# Patient Record
Sex: Male | Born: 1940 | Race: Black or African American | Hispanic: No | Marital: Married | Smoking: Never smoker
Health system: Southern US, Community
[De-identification: ages and names within clinical notes are randomized; demographics above are authoritative.]

---

## 2016-05-16 ENCOUNTER — Emergency Department
Admission: EM | Admit: 2016-05-16 | Discharge: 2016-05-17 | Payer: Medicare Other | Attending: Emergency Medicine | Admitting: Emergency Medicine

## 2016-05-16 ENCOUNTER — Emergency Department: Payer: Medicare Other

## 2016-05-16 DIAGNOSIS — Z7982 Long term (current) use of aspirin: Secondary | ICD-10-CM | POA: Diagnosis not present

## 2016-05-16 DIAGNOSIS — R55 Syncope and collapse: Secondary | ICD-10-CM | POA: Diagnosis present

## 2016-05-16 DIAGNOSIS — Z79899 Other long term (current) drug therapy: Secondary | ICD-10-CM | POA: Diagnosis not present

## 2016-05-16 DIAGNOSIS — R9431 Abnormal electrocardiogram [ECG] [EKG]: Secondary | ICD-10-CM | POA: Diagnosis not present

## 2016-05-16 LAB — COMPREHENSIVE METABOLIC PANEL
ALT: 18 U/L (ref 17–63)
AST: 30 U/L (ref 15–41)
Albumin: 4.3 g/dL (ref 3.5–5.0)
Alkaline Phosphatase: 85 U/L (ref 38–126)
Anion gap: 6 (ref 5–15)
BILIRUBIN TOTAL: 0.8 mg/dL (ref 0.3–1.2)
BUN: 20 mg/dL (ref 6–20)
CALCIUM: 9 mg/dL (ref 8.9–10.3)
CHLORIDE: 105 mmol/L (ref 101–111)
CO2: 26 mmol/L (ref 22–32)
CREATININE: 1.39 mg/dL — AB (ref 0.61–1.24)
GFR, EST AFRICAN AMERICAN: 55 mL/min — AB (ref >60)
GFR, EST NON AFRICAN AMERICAN: 48 mL/min — AB (ref >60)
Glucose, Bld: 116 mg/dL — ABNORMAL HIGH (ref 65–99)
Potassium: 4.3 mmol/L (ref 3.5–5.1)
Sodium: 137 mmol/L (ref 135–145)
TOTAL PROTEIN: 7.8 g/dL (ref 6.5–8.1)

## 2016-05-16 LAB — CBC WITH DIFFERENTIAL/PLATELET
BASOS ABS: 0 10*3/uL (ref 0–0.1)
BASOS PCT: 0 %
EOS ABS: 0 10*3/uL (ref 0–0.7)
Eosinophils Relative: 1 %
HCT: 43.2 % (ref 40.0–52.0)
Hemoglobin: 14.5 g/dL (ref 13.0–18.0)
Lymphocytes Relative: 15 %
Lymphs Abs: 1 10*3/uL (ref 1.0–3.6)
MCH: 32.6 pg (ref 26.0–34.0)
MCHC: 33.5 g/dL (ref 32.0–36.0)
MCV: 97.1 fL (ref 80.0–100.0)
MONO ABS: 0.4 10*3/uL (ref 0.2–1.0)
Monocytes Relative: 7 %
Neutro Abs: 5.2 10*3/uL (ref 1.4–6.5)
Neutrophils Relative %: 77 %
PLATELETS: 186 10*3/uL (ref 150–440)
RBC: 4.44 MIL/uL (ref 4.40–5.90)
RDW: 14.3 % (ref 11.5–14.5)
WBC: 6.6 10*3/uL (ref 3.8–10.6)

## 2016-05-16 LAB — TROPONIN I

## 2016-05-16 MED ORDER — ACETAMINOPHEN 325 MG PO TABS
ORAL_TABLET | ORAL | Status: AC
  Filled 2016-05-16: qty 2

## 2016-05-16 MED ORDER — ACETAMINOPHEN 325 MG PO TABS
650.0000 mg | ORAL_TABLET | Freq: Once | ORAL | Status: AC
  Administered 2016-05-16: 650 mg via ORAL

## 2016-05-16 NOTE — ED Notes (Signed)
[facility] on [facility], spoke to [place], information passed on to ED MD waiting on acceptance [phone]

## 2016-05-16 NOTE — ED Provider Notes (Addendum)
[facility] Emergency Department Provider Note   ____________________________________________   First MD Initiated Contact with Patient 05/16/16 1151     (approximate)  I have reviewed the triage vital signs and the nursing notes.   HISTORY  Chief Complaint Loss of Consciousness   HPI Arnold LongDaniel Arciniega is a 75 y.o. male [facility] patient who reports he was getting out of the car today Hot headed and then passed out he was not out for very long. He says last time this happened [place]. He also reports he had a very bad headache gradual onset diffuse came on before he passed out and is now getting much better. He has had headaches like this in the past. He does not have any chest pain shortness of breath or any other complaints at the present time.   No past medical history on file. 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   No past surgical history on file.  Prior to Admission medications   Medication Sig Start Date End Date Taking? Authorizing Provider  amLODipine (NORVASC) 5 MG tablet Take 5 mg by mouth daily. 01/17/16 01/16/17 Yes Historical Provider, MD  aspirin EC 81 MG tablet Take 81 mg by mouth daily.   Yes Historical Provider, MD  clopidogrel (PLAVIX) 75 MG tablet Take 75 mg by mouth daily. 04/13/16  Yes Historical Provider, MD  hydrOXYzine (VISTARIL) 25 MG capsule Take 25 mg by mouth daily as needed.   Yes Historical Provider, MD  lisinopril (PRINIVIL,ZESTRIL) 40 MG tablet Take 40 mg by mouth daily.   Yes Historical Provider, MD  nitroGLYCERIN (NITROSTAT) 0.4 MG SL tablet Place 0.4 mg under the tongue every 5 (five) minutes x 3 doses as needed.   Yes Historical Provider, MD    Allergies Lisinopril; Beta adrenergic blockers; and Simvastatin  No family history on  file.  Social History Social History  Substance Use Topics  . Smoking status: Not on file  . Smokeless tobacco: Not on file  . Alcohol use Not on file    Review of Systems Constitutional: No fever/chills Eyes: No visual changes. ENT: No sore throat. Cardiovascular: Denies chest pain. Respiratory: Denies shortness of breath. Gastrointestinal: No abdominal pain.  No nausea, no vomiting.  No diarrhea.  No constipation. Genitourinary: Negative for dysuria. Musculoskeletal: Negative for back pain. Skin: Negative for rash.  10-point ROS otherwise negative.  ____________________________________________   PHYSICAL EXAM:  VITAL SIGNS: ED Triage Vitals  Enc Vitals Group     BP 05/16/16 1149 98/72     Pulse Rate 05/16/16 1149 66     Resp 05/16/16 1149 18     Temp 05/16/16 1149 98.1 F (36.7 C)     Temp Source 05/16/16 1149 Oral     SpO2 05/16/16 1149 94 %     Weight 05/16/16 1150 160 lb (72.6 kg)     Height 05/16/16 1150 5\' 10"  (1.778 m)     Head Circumference --      Peak Flow --      Pain Score --      Pain Loc --      Pain Edu? --      Excl. in GC? --     Constitutional: Alert and oriented. Well appearing and in no acute distress. Eyes: Conjunctivae are normal.  PERRL. EOMI. Head: Atraumatic. Nose: No congestion/rhinnorhea. Mouth/Throat: Mucous membranes are moist.  Oropharynx non-erythematous. Neck: No stridor.  Cardiovascular: Normal rate, regular rhythm. Grossly normal heart sounds.  Good peripheral circulation. Respiratory: Normal respiratory effort.  No retractions. Lungs CTAB. Gastrointestinal: Soft and nontender. No distention. No abdominal bruits. No CVA tenderness. Musculoskeletal: No lower extremity tenderness nor edema.  No joint effusions. Neurologic:  Normal speech and language. Cranial nerves II through XII are intact cerebellar finger to nose is slow but intact patient has been intention tremor on the right side in the right arm with any movements he  does not have a resting tremor. He has some weakness in the right arm. This is all chronic.  ____________________________________________   LABS (all labs ordered are listed, but only abnormal results are displayed)  Labs Reviewed  COMPREHENSIVE METABOLIC PANEL - Abnormal; Notable for the following:       Result Value   Glucose, Bld 116 (*)    Creatinine, Ser 1.39 (*)    GFR calc non Af Amer 48 (*)    GFR calc Af Amer 55 (*)    All other components within normal limits  TROPONIN I  CBC WITH DIFFERENTIAL/PLATELET  TROPONIN I   ____________________________________________  EKG  KG read and is interpreted by me shows normal sinus rhythm rate of 64 first-degree AV block left axis patient has flipped T's inferiorly and in the V5 and 6 and lateral chest leads there is minimal ST elevation in V1 through 3. I do not have any old EKGs  Second EKG shows increasing T wave inversions laterally. EKG was obtained for [facility] where his last EKG was done and this one also looks worse than the old one from [facility]. Discussed with hospitalist and he agrees we should admit the patient at least for observation. ____________________________________________  RADIOLOGY  Study Result   CLINICAL DATA:  Syncopal episode today when getting out of a car, felt hot then passed out, headache this morning, initial encounter  EXAM: CT HEAD WITHOUT CONTRAST  TECHNIQUE: Contiguous axial images were obtained from the base of the skull through the vertex without intravenous contrast.  COMPARISON:  None  FINDINGS: Brain: Mild age-related atrophy. Normal ventricular morphology. No midline shift or mass effect. Small vessel chronic ischemic changes of deep cerebral white matter. No intracranial hemorrhage, mass lesion, evidence of acute infarction, or extra-axial fluid collection.  Vascular: Normal appearance  Skull: Intact  Sinuses/Orbits: Clear  Other: N/A  IMPRESSION: Small vessel chronic  ischemic changes of deep cerebral white matter.  No acute intracranial abnormalities.   Electronically Signed   By: Ulyses Southward M.D.   On: 05/16/2016 12:23   Study Result   CLINICAL DATA:  Syncopal episode today  EXAM: CHEST  2 VIEW  COMPARISON:  None.  FINDINGS: Cardiac shadow is within normal limits. The lungs are well aerated bilaterally. Minimal bibasilar atelectasis is seen. Prior left shoulder replacement is noted. No other bony abnormality is seen.  IMPRESSION: Minimal bibasilar atelectasis.   Electronically Signed   By: Alcide Clever M.D.   On: 05/16/2016 12:36     ____________________________________________   PROCEDURES  Procedure(s) performed:   Procedures  Critical Care performed: Critical care time 45 minutes. Discussed with patient risks of transfer to the [place] patient if she wanted to just go home but with the EKG looking different I discussed with him the need for admission and he agreed and then I discussed with him the fact that since he is a  [facility] if [facility] not at [facility] [place] [place] patient thought about it for sometime and then decided to go [place] which time I signed his cons   Clinical Course      ____________________________________________   FINAL CLINICAL IMPRESSION(S) / ED DIAGNOSES  Final diagnoses:  Syncope and collapse  Abnormal EKG      NEW MEDICATIONS STARTED DURING THIS VISIT:  New Prescriptions   No medications on file     Note:  This document was prepared using Dragon voice recognition software and may include unintentional dictation errors.    Arnaldo NatalPaul F Malinda, MD 05/16/16 1827   [facility]'re awaiting the [facility] to determine if they want to [facility] this patient not. I had to talk him into staying twice now because of his EKG changes. The [facility] would not [facility] him I would   Arnaldo NatalPaul F Malinda, MD 05/16/16 2018

## 2016-05-16 NOTE — ED Triage Notes (Signed)
Pt arrived via EMS for syncopal episode today when getting out of his car. Pt reports feeling hot and then passed out. Pt reports headache this morning, denies pain on arrival. EMS reports 110/77, 74 HR, 95% RA, CBG 159. EMS reports pt took 81 mg ASA this morning. EMS administered ASA 243 mg.

## 2016-05-16 NOTE — ED Notes (Signed)
[facility] to check on [facility], Ms Fred Johnson said they were reviewing and processing the request.  [phone]

## 2016-05-16 NOTE — ED Notes (Signed)
Pt states he wishes to sign out AMA, MD notified and to bedside to speak with patient at this time.

## 2016-05-17 NOTE — ED Notes (Signed)
BP in original emtala entered incorrectly; BP 132/76, NOT 232/76

## 2016-05-17 NOTE — ED Notes (Signed)
[facility] when [facility] is on the way.   [phone] Ext: [phone]

## 2016-05-17 NOTE — ED Notes (Signed)
RN spoke with Consulting civil engineerCharge RN at [facility] and let her know EMS has arrived and patient on the way

## 2018-06-20 IMAGING — CT CT HEAD W/O CM
3 series · 15 of 45 positions shown, 18 images · non-contrast
Comparison: None

CLINICAL DATA: Syncopal episode today when getting out of a car,
felt hot then passed out, headache this morning, initial encounter

EXAM:
CT HEAD WITHOUT CONTRAST
TECHNIQUE: Contiguous axial images were obtained from the base of the skull
through the vertex without intravenous contrast.

[Series 2: head wo · axial · 0.47mm/px · z∈[-141,-26]mm · 9 of 28 slices shown, 12 images]
[im 3/28  brain]
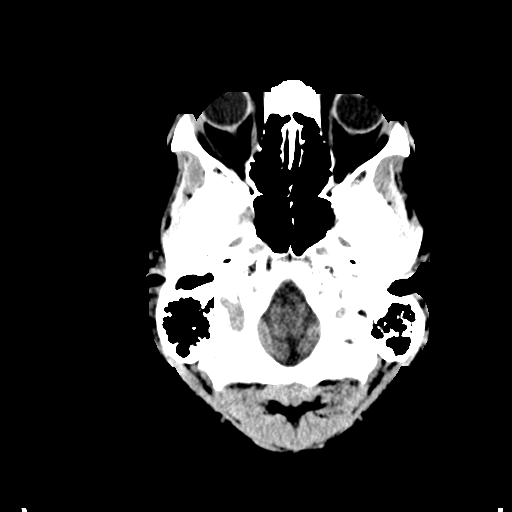
[im 3/28  bone]
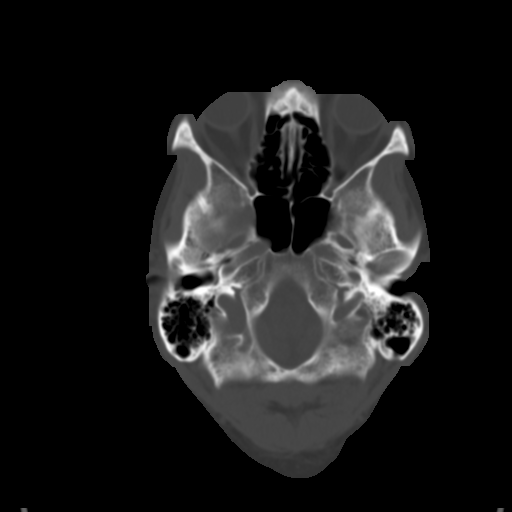
[im 6/28  brain]
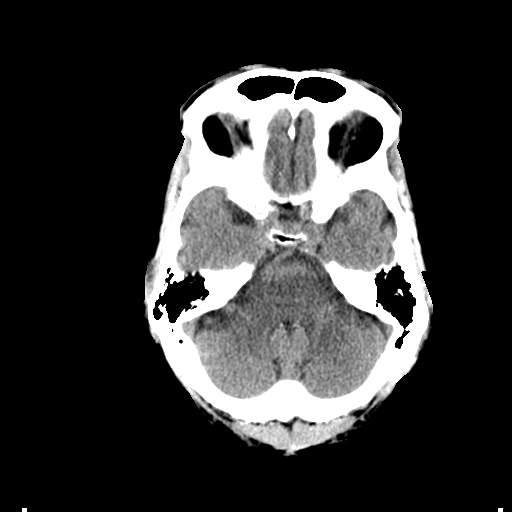
[im 9/28  brain]
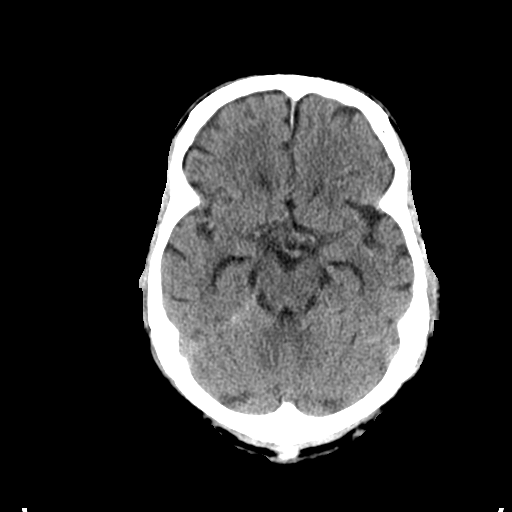
[im 12/28  brain]
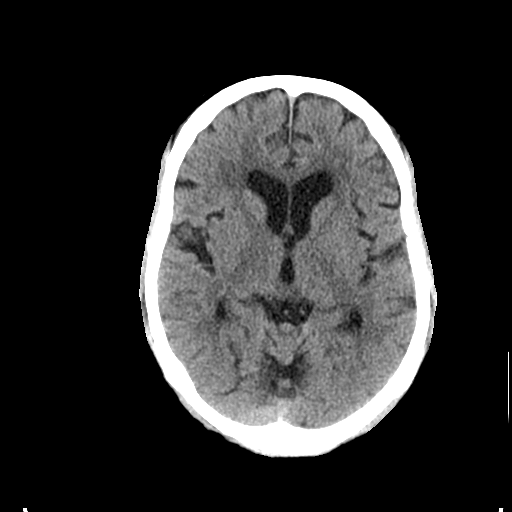
[im 15/28  brain]
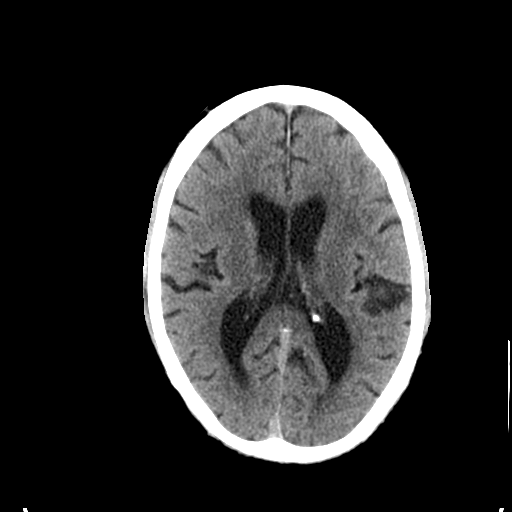
[im 15/28  bone]
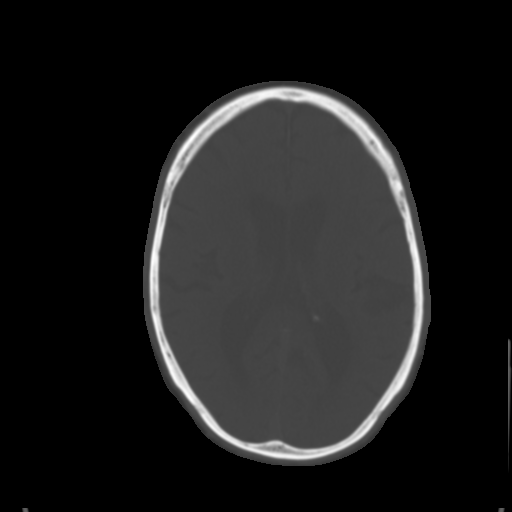
[im 17/28  brain]
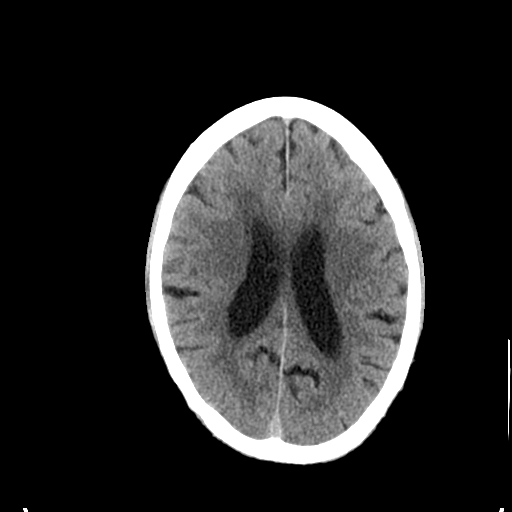
[im 20/28  brain]
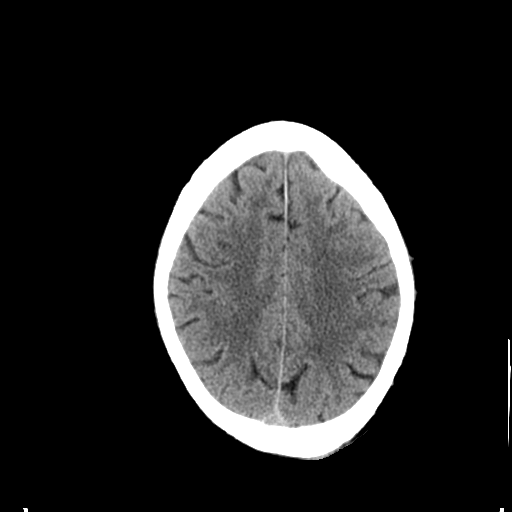
[im 23/28  brain]
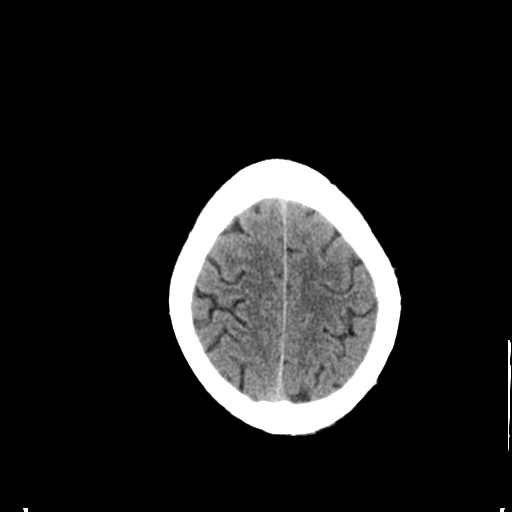
[im 26/28  brain]
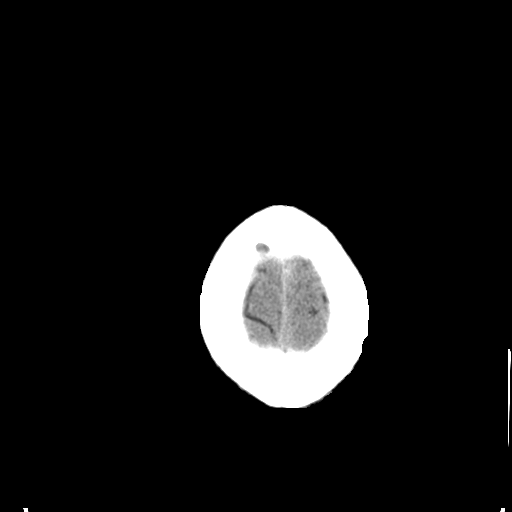
[im 26/28  bone]
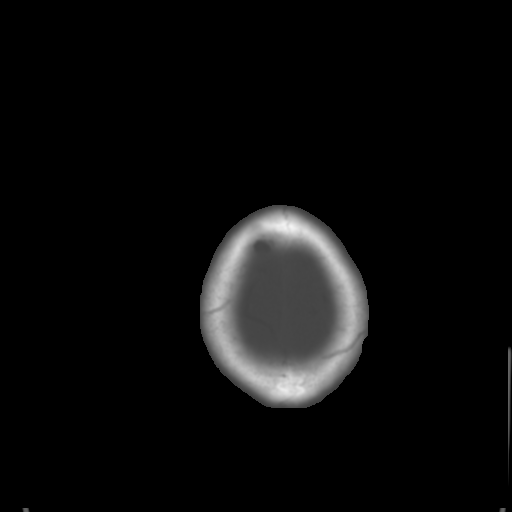

[Series 4: coronal soft tissue · coronal · 0.27mm/px · 3 of 61 slices shown]
[im 21/61  brain]
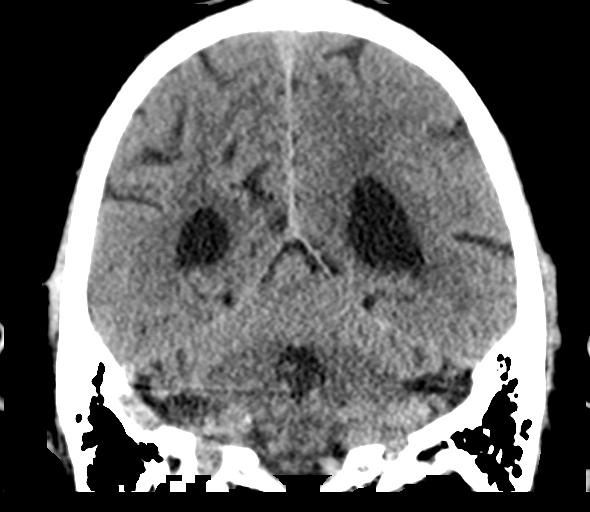
[im 27/61  brain]
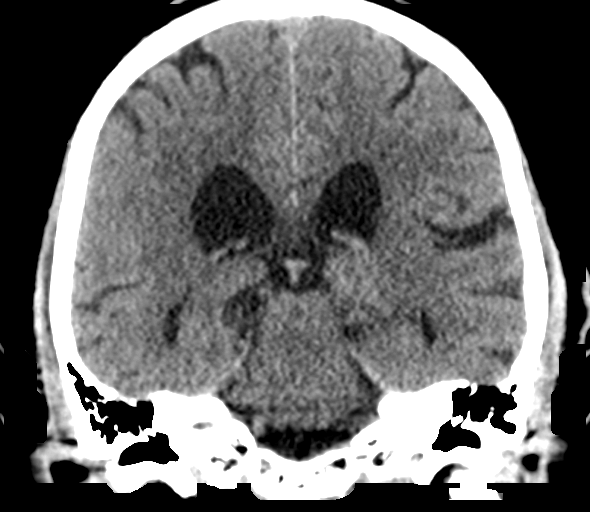
[im 34/61  brain]
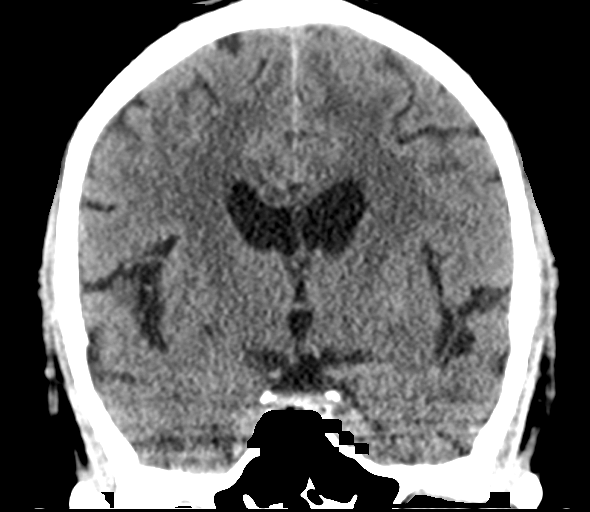

[Series 5: sagittal soft tissue · sagittal · 0.31mm/px · 3 of 49 slices shown]
[im 17/49  brain]
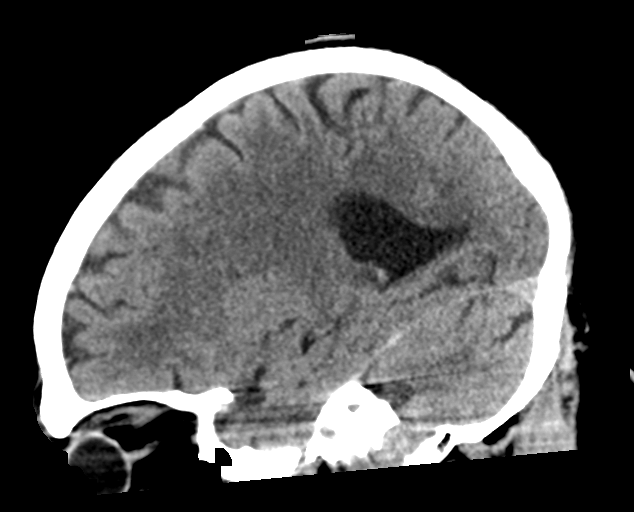
[im 25/49  brain]
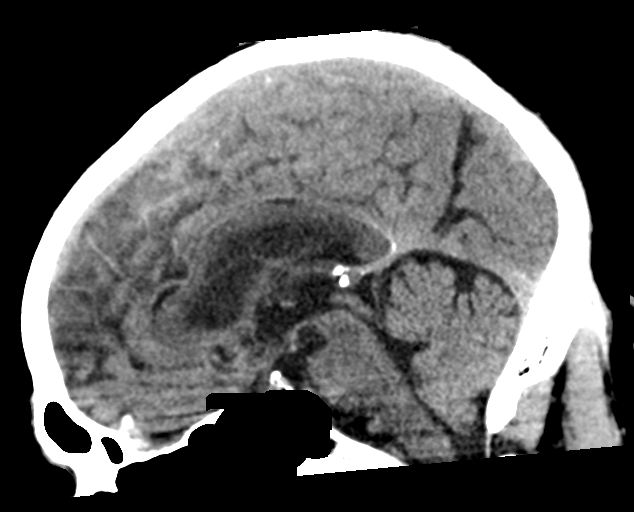
[im 33/49  brain]
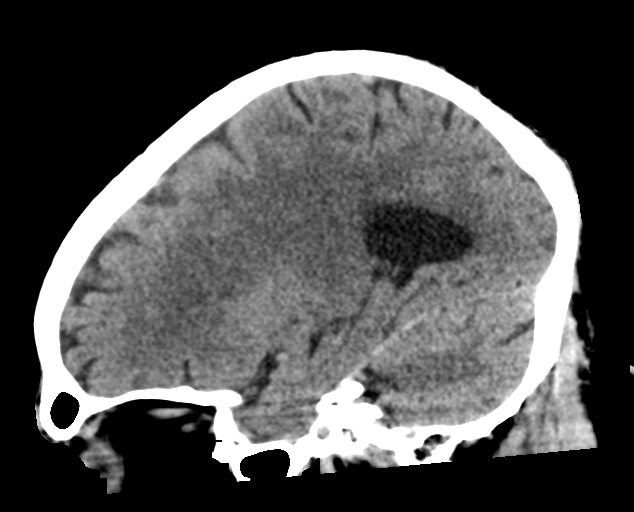

[15 of 45 positions shown; findings below may reference images not displayed]

FINDINGS: Brain: Mild age-related atrophy. Normal ventricular morphology. No
midline shift or mass effect. Small vessel chronic ischemic changes
of deep cerebral white matter. No intracranial hemorrhage, mass
lesion, evidence of acute infarction, or extra-axial fluid
collection.

Vascular: Normal appearance

Skull: Intact

Sinuses/Orbits: Clear

Other: N/A
IMPRESSION: Small vessel chronic ischemic changes of deep cerebral white matter.

No acute intracranial abnormalities.

## 2018-06-20 IMAGING — CR DG CHEST 2V
2 series · 2 of 2 positions shown · non-contrast
Comparison: None.

CLINICAL DATA: Syncopal episode today

EXAM:
CHEST  2 VIEW

[chest lat]
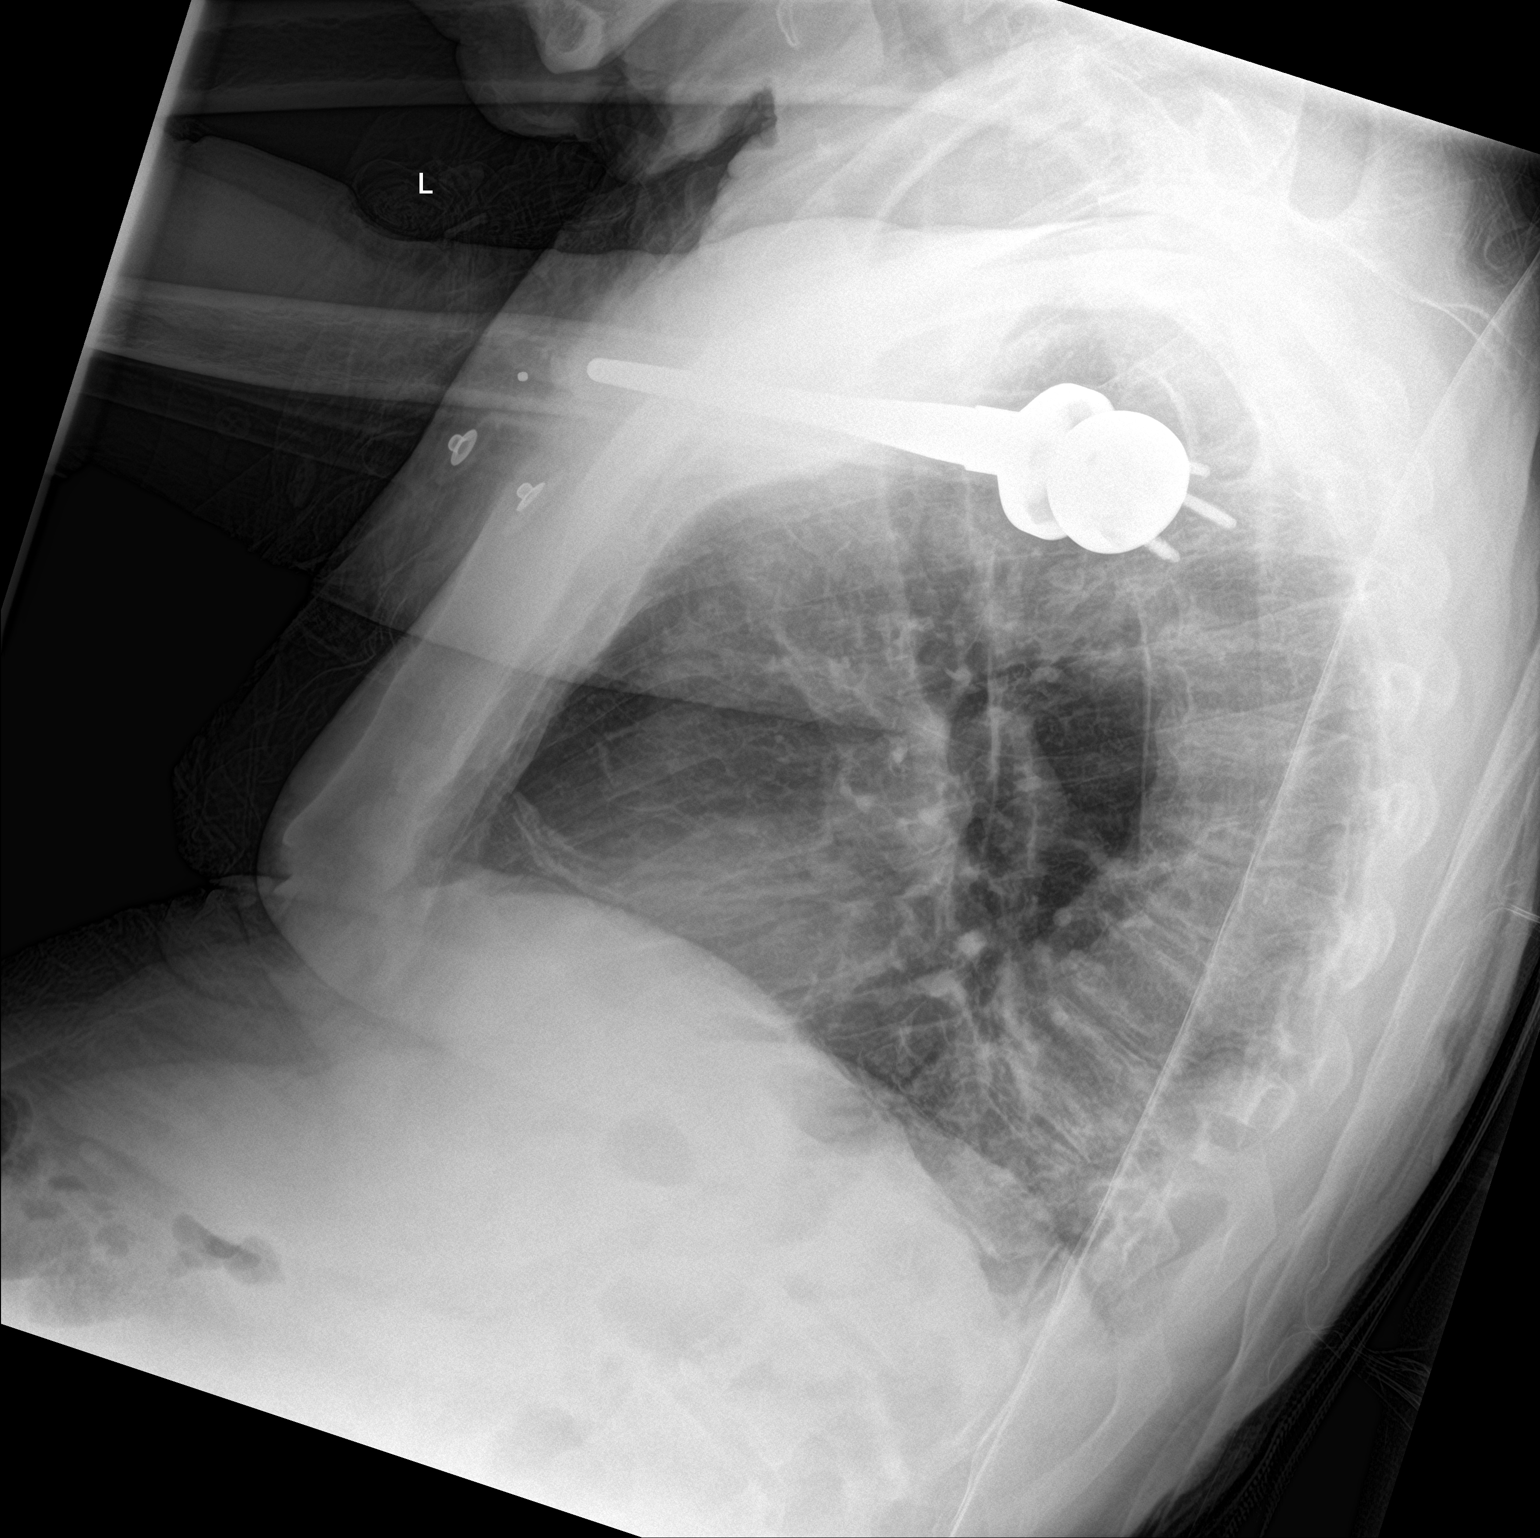

[chest ap]
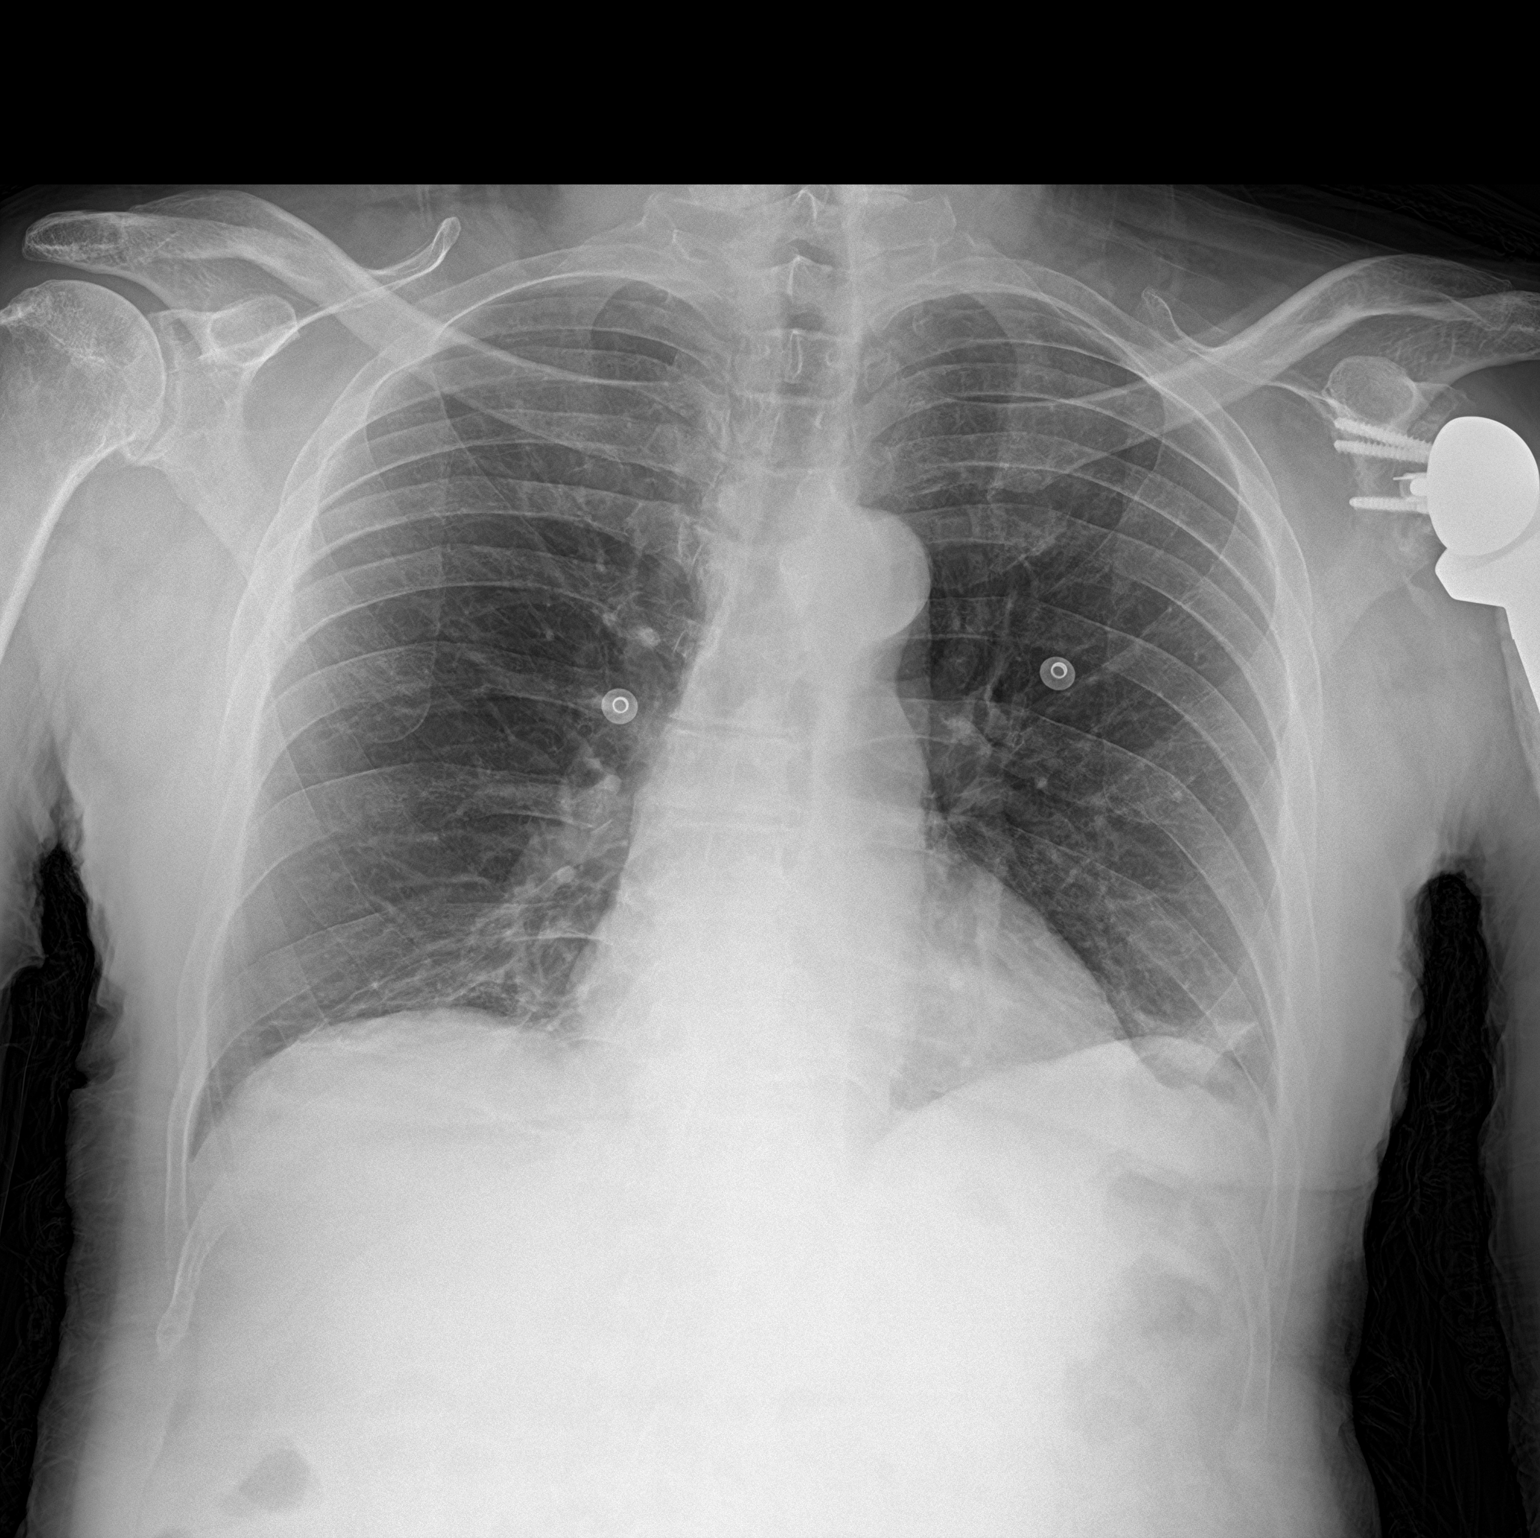

[2 of 2 positions shown; findings below may reference images not displayed]

FINDINGS: Cardiac shadow is within normal limits. The lungs are well aerated
bilaterally. Minimal bibasilar atelectasis is seen. Prior left
shoulder replacement is noted. No other bony abnormality is seen.
IMPRESSION: Minimal bibasilar atelectasis.

## 2020-01-05 ENCOUNTER — Other Ambulatory Visit: Payer: Self-pay

## 2020-01-05 ENCOUNTER — Encounter: Payer: Non-veteran care | Attending: General Practice

## 2020-01-05 DIAGNOSIS — I2510 Atherosclerotic heart disease of native coronary artery without angina pectoris: Secondary | ICD-10-CM

## 2020-01-05 NOTE — Progress Notes (Signed)
Virtual Visit completed. Patient informed on EP and RD appointment and 6 Minute walk test. Patient also informed of patient health questionnaires on My Chart. Patient Verbalizes understanding. Visit diagnosis can be found in Media. Patient is [facility].

## 2023-05-25 DIAGNOSIS — R7989 Other specified abnormal findings of blood chemistry: Secondary | ICD-10-CM | POA: Diagnosis not present

## 2023-06-28 ENCOUNTER — Other Ambulatory Visit: Payer: Self-pay

## 2023-06-28 ENCOUNTER — Emergency Department
Admission: EM | Admit: 2023-06-28 | Discharge: 2023-06-28 | Disposition: A | Discharge status: Home / Self Care | Payer: No Typology Code available for payment source | Attending: Emergency Medicine | Admitting: Emergency Medicine

## 2023-06-28 ENCOUNTER — Emergency Department: Payer: Non-veteran care

## 2023-06-28 DIAGNOSIS — R531 Weakness: Secondary | ICD-10-CM

## 2023-06-28 DIAGNOSIS — R059 Cough, unspecified: Secondary | ICD-10-CM | POA: Diagnosis present

## 2023-06-28 DIAGNOSIS — R079 Chest pain, unspecified: Secondary | ICD-10-CM

## 2023-06-28 DIAGNOSIS — U071 COVID-19: Secondary | ICD-10-CM | POA: Diagnosis not present

## 2023-06-28 LAB — CBC WITH DIFFERENTIAL/PLATELET
Abs Immature Granulocytes: 0.09 10*3/uL — ABNORMAL HIGH (ref 0.00–0.07)
Basophils Absolute: 0 10*3/uL (ref 0.0–0.1)
Basophils Relative: 0 %
Eosinophils Absolute: 0 10*3/uL (ref 0.0–0.5)
Eosinophils Relative: 0 %
HCT: 41.5 % (ref 39.0–52.0)
Hemoglobin: 14.1 g/dL (ref 13.0–17.0)
Immature Granulocytes: 1 %
Lymphocytes Relative: 12 %
Lymphs Abs: 1.1 10*3/uL (ref 0.7–4.0)
MCH: 33.2 pg (ref 26.0–34.0)
MCHC: 34 g/dL (ref 30.0–36.0)
MCV: 97.6 fL (ref 80.0–100.0)
Monocytes Absolute: 0.9 10*3/uL (ref 0.1–1.0)
Monocytes Relative: 10 %
Neutro Abs: 7.1 10*3/uL (ref 1.7–7.7)
Neutrophils Relative %: 77 %
Platelets: 183 10*3/uL (ref 150–400)
RBC: 4.25 MIL/uL (ref 4.22–5.81)
RDW: 13.4 % (ref 11.5–15.5)
WBC: 9.3 10*3/uL (ref 4.0–10.5)
nRBC: 0.2 % (ref 0.0–0.2)

## 2023-06-28 LAB — COMPREHENSIVE METABOLIC PANEL
ALT: 12 U/L (ref 0–44)
AST: 17 U/L (ref 15–41)
Albumin: 3.8 g/dL (ref 3.5–5.0)
Alkaline Phosphatase: 77 U/L (ref 38–126)
Anion gap: 13 (ref 5–15)
BUN: 15 mg/dL (ref 8–23)
CO2: 22 mmol/L (ref 22–32)
Calcium: 9 mg/dL (ref 8.9–10.3)
Chloride: 103 mmol/L (ref 98–111)
Creatinine, Ser: 1.37 mg/dL — ABNORMAL HIGH (ref 0.61–1.24)
GFR, Estimated: 51 mL/min — ABNORMAL LOW (ref >60)
Glucose, Bld: 137 mg/dL — ABNORMAL HIGH (ref 70–99)
Potassium: 3.9 mmol/L (ref 3.5–5.1)
Sodium: 138 mmol/L (ref 135–145)
Total Bilirubin: 0.8 mg/dL (ref 0.0–1.2)
Total Protein: 7.1 g/dL (ref 6.5–8.1)

## 2023-06-28 LAB — RESP PANEL BY RT-PCR (RSV, FLU A&B, COVID)  RVPGX2
Influenza A by PCR: NEGATIVE
Influenza B by PCR: NEGATIVE
Resp Syncytial Virus by PCR: NEGATIVE
SARS Coronavirus 2 by RT PCR: POSITIVE — AB

## 2023-06-28 LAB — TROPONIN I (HIGH SENSITIVITY)
Troponin I (High Sensitivity): 28 ng/L — ABNORMAL HIGH (ref <18)
Troponin I (High Sensitivity): 26 ng/L — ABNORMAL HIGH (ref <18)

## 2023-06-28 MED ORDER — NIRMATRELVIR/RITONAVIR (PAXLOVID)TABLET: 3.0000 | ORAL_TABLET | Freq: Two times a day (BID) | ORAL | 0 refills | Status: AC

## 2023-06-28 NOTE — ED Provider Notes (Signed)
[facility] Provider Note    Event Date/Time   First MD Initiated Contact with Patient 06/28/23 [phone]     (approximate)   History   Illness   HPI  Fred Johnson is a 83 year old male presenting to the emergency department for evaluation of cough and congestion.  For the last 3 days, patient has had ongoing cough and upper respiratory symptoms.  Girlfriend sick with similar symptoms.  Denies shortness of breath.  Reports some ongoing chest pain for the past several weeks, not acutely changed.  Does report general weakness.  His girlfriend wanted him to get evaluated in the ER.  Reports subjective fevers. No N/V/D.      Physical Exam   Triage Vital Signs: ED Triage Vitals  Encounter Vitals Group     BP 06/28/23 0945 (!) 150/90     Systolic BP Percentile --      Diastolic BP Percentile --      Pulse Rate 06/28/23 0945 84     Resp 06/28/23 0945 17     Temp 06/28/23 0947 100.3 F (37.9 C)     Temp Source 06/28/23 0947 Oral     SpO2 06/28/23 0945 97 %     Weight 06/28/23 0946 160 lb 0.9 oz (72.6 kg)     Height 06/28/23 0946 5\' 10"  (1.778 m)     Head Circumference --      Peak Flow --      Pain Score --      Pain Loc --      Pain Education --      Exclude from Growth Chart --     Most recent vital signs: Vitals:   06/28/23 1100 06/28/23 1200  BP: 136/82 132/83  Pulse: 73 70  Resp:    Temp:    SpO2: 97% 97%     General: Awake, interactive  HEENT: Nasal congestion present, moist mucous membranes CV:  Regular rate, good peripheral perfusion.  Resp:  Unlabored respirations, lungs good auscultation Abd:  Nondistended, soft, nontender to palpation Neuro:  Symmetric facial movement, fluid speech   ED Results / Procedures / Treatments   Labs (all labs ordered are listed, but only abnormal results are displayed) Labs Reviewed  RESP PANEL BY RT-PCR (RSV, FLU A&B, COVID)  RVPGX2 - Abnormal; Notable for the following components:      Result  Value   SARS Coronavirus 2 by RT PCR POSITIVE (*)    All other components within normal limits  CBC WITH DIFFERENTIAL/PLATELET - Abnormal; Notable for the following components:   Abs Immature Granulocytes 0.09 (*)    All other components within normal limits  COMPREHENSIVE METABOLIC PANEL - Abnormal; Notable for the following components:   Glucose, Bld 137 (*)    Creatinine, Ser 1.37 (*)    GFR, Estimated 51 (*)    All other components within normal limits  TROPONIN I (HIGH SENSITIVITY) - Abnormal; Notable for the following components:   Troponin I (High Sensitivity) 28 (*)    All other components within normal limits  TROPONIN I (HIGH SENSITIVITY) - Abnormal; Notable for the following components:   Troponin I (High Sensitivity) 26 (*)    All other components within normal limits     EKG EKG independently reviewed interpreted by myself (ER attending) demonstrates:  EKG demonstrates sinus rhythm at a rate of 85, PR 236, QRS 114, QTc 523, no acute ST changes though artifact limits station  RADIOLOGY Imaging independently reviewed and interpreted by  myself demonstrates:  CXR without focal consolidation  PROCEDURES:  Critical Care performed: No  Procedures   MEDICATIONS ORDERED IN ED: Medications - No data to display   IMPRESSION / MDM / ASSESSMENT AND PLAN / ED COURSE  I reviewed the triage vital signs and the nursing notes.  Differential diagnosis includes, but is not limited to, viral illness, pneumonia, anemia, electrolyte abnormality, arrhythmia, much lower suspicion ACS  Patient's presentation is most consistent with acute presentation with potential threat to life or bodily function.  83 year old male presenting with cough and weakness, additionally reports several weeks of chest pain.  Vital stable on presentation.  Reassuring CBC, CMP without critical derangements, stable creatinine from distant prior in our system.  Troponin slightly elevated at 28, will plan to  obtain repeat.  CXR without focal consolidation.  Troponin stable at 26, low suspicion for ACS.  Do think he is stable for outpatient follow-up.  COVID test did return positive.  Patient reevaluated.  Updated on results of workup.  He is comfortable with discharge home.  Did wish to proceed with Paxlovid.  I did review his medication list in care everywhere from the [place].  He was instructed to hold his atorvastatin while taking the Paxlovid and monitor his blood pressure while taking the valsartan.  Patient expressed understanding.  Strict return precautions provided.  Patient discharged in stable condition.     FINAL CLINICAL IMPRESSION(S) / ED DIAGNOSES   Final diagnoses:  COVID-19  Generalized weakness  Nonspecific chest pain     Rx / DC Orders   ED Discharge Orders          Ordered    nirmatrelvir/ritonavir (PAXLOVID) 20 x 150 MG & 10 x 100MG  TABS  2 times daily        06/28/23 1254             Note:  This document was prepared using Dragon voice recognition software and may include unintentional dictation errors.   Trinna Post, MD 06/28/23 [phone]

## 2023-06-28 NOTE — ED Notes (Signed)
Patient transported to X-ray

## 2023-06-28 NOTE — ED Notes (Signed)
This tech and Vance Gather NT assisted pt with standing and getting pants on. Pt said he "was really dizzy" when standing. Pt was assisted back into bed. Pt now resting at this time with family at bedside.

## 2023-06-28 NOTE — ED Notes (Signed)
Called c com for transport to [address] [place]

## 2023-06-28 NOTE — Discharge Instructions (Signed)
You were seen in the ER today for evaluation of your cough and congestion.  Your testing did demonstrate that you are positive for COVID.  We fortunately did not see other emergency findings.  I sent a prescription for Paxlovid to your pharmacy.  While you are taking this, please do not take your atorvastatin.  Please monitor your blood pressure and hold any blood pressure medications if your blood pressure is low.  Return to the ER for new or worsening symptoms.

## 2023-06-28 NOTE — ED Triage Notes (Addendum)
Pt here via [facility] with a cough and flu like symptoms x3 days ago. Pt states his gf has been ill recently. Pt denies cp or SOB. Pt has been generally weak. Pt took 650 mg of Tylenol at 0930.   140/94 148-cbg 97% RA 86
